# Patient Record
Sex: Female | Born: 1935 | Race: White | Hispanic: No | Smoking: Current some day smoker
Health system: Southern US, Community
[De-identification: ages and names within clinical notes are randomized; demographics above are authoritative.]

## PROBLEM LIST (undated history)

## (undated) DIAGNOSIS — F419 Anxiety disorder, unspecified: Secondary | ICD-10-CM

## (undated) DIAGNOSIS — I10 Essential (primary) hypertension: Secondary | ICD-10-CM

## (undated) DIAGNOSIS — E119 Type 2 diabetes mellitus without complications: Secondary | ICD-10-CM

## (undated) DIAGNOSIS — M109 Gout, unspecified: Secondary | ICD-10-CM

## (undated) HISTORY — DX: Gout, unspecified: M10.9

## (undated) HISTORY — DX: Type 2 diabetes mellitus without complications: E11.9

## (undated) HISTORY — DX: Anxiety disorder, unspecified: F41.9

## (undated) HISTORY — DX: Essential (primary) hypertension: I10

---

## 2004-01-06 ENCOUNTER — Ambulatory Visit: Payer: Self-pay | Admitting: Physician Assistant

## 2004-02-01 ENCOUNTER — Ambulatory Visit: Payer: Self-pay | Admitting: Pain Medicine

## 2004-02-09 ENCOUNTER — Ambulatory Visit: Payer: Self-pay | Admitting: Pain Medicine

## 2004-03-21 ENCOUNTER — Ambulatory Visit: Payer: Self-pay | Admitting: Pain Medicine

## 2004-03-24 ENCOUNTER — Ambulatory Visit: Payer: Self-pay | Admitting: Family Medicine

## 2004-04-20 ENCOUNTER — Ambulatory Visit: Payer: Self-pay | Admitting: Pain Medicine

## 2004-05-01 ENCOUNTER — Ambulatory Visit: Payer: Self-pay | Admitting: Pain Medicine

## 2004-06-06 ENCOUNTER — Ambulatory Visit: Payer: Self-pay | Admitting: Pain Medicine

## 2004-06-14 ENCOUNTER — Ambulatory Visit: Payer: Self-pay | Admitting: Pain Medicine

## 2004-07-20 ENCOUNTER — Ambulatory Visit: Payer: Self-pay | Admitting: Pain Medicine

## 2004-07-26 ENCOUNTER — Ambulatory Visit: Payer: Self-pay | Admitting: Pain Medicine

## 2004-08-31 ENCOUNTER — Ambulatory Visit: Payer: Self-pay | Admitting: Pain Medicine

## 2004-09-13 ENCOUNTER — Encounter: Payer: Self-pay | Admitting: Pain Medicine

## 2004-09-28 ENCOUNTER — Ambulatory Visit: Payer: Self-pay | Admitting: Pain Medicine

## 2004-10-04 ENCOUNTER — Encounter: Payer: Self-pay | Admitting: Pain Medicine

## 2004-10-26 ENCOUNTER — Ambulatory Visit: Payer: Self-pay | Admitting: Pain Medicine

## 2004-10-31 ENCOUNTER — Encounter: Payer: Self-pay | Admitting: Pain Medicine

## 2004-11-27 ENCOUNTER — Ambulatory Visit: Payer: Self-pay | Admitting: Psychiatry

## 2004-12-01 ENCOUNTER — Encounter: Payer: Self-pay | Admitting: Pain Medicine

## 2004-12-14 ENCOUNTER — Ambulatory Visit: Payer: Self-pay | Admitting: Pain Medicine

## 2004-12-25 ENCOUNTER — Ambulatory Visit: Payer: Self-pay | Admitting: Family Medicine

## 2004-12-31 ENCOUNTER — Ambulatory Visit: Payer: Self-pay | Admitting: Family Medicine

## 2005-02-13 ENCOUNTER — Ambulatory Visit: Payer: Self-pay | Admitting: Pain Medicine

## 2005-04-17 ENCOUNTER — Ambulatory Visit: Payer: Self-pay | Admitting: Pain Medicine

## 2005-05-22 ENCOUNTER — Ambulatory Visit: Payer: Self-pay | Admitting: Pain Medicine

## 2005-08-14 ENCOUNTER — Ambulatory Visit: Payer: Self-pay | Admitting: Pain Medicine

## 2005-11-13 ENCOUNTER — Ambulatory Visit: Payer: Self-pay | Admitting: Pain Medicine

## 2006-02-14 ENCOUNTER — Ambulatory Visit: Payer: Self-pay

## 2006-07-28 ENCOUNTER — Other Ambulatory Visit: Payer: Self-pay

## 2006-07-28 ENCOUNTER — Emergency Department: Payer: Self-pay | Admitting: Emergency Medicine

## 2008-03-29 ENCOUNTER — Ambulatory Visit: Payer: Self-pay | Admitting: Family Medicine

## 2008-04-07 ENCOUNTER — Ambulatory Visit: Payer: Self-pay | Admitting: Family Medicine

## 2010-01-17 ENCOUNTER — Ambulatory Visit: Payer: Self-pay

## 2011-04-12 ENCOUNTER — Ambulatory Visit: Payer: Self-pay | Admitting: Family Medicine

## 2011-09-02 ENCOUNTER — Inpatient Hospital Stay: Payer: Self-pay | Admitting: Internal Medicine

## 2011-09-02 LAB — BASIC METABOLIC PANEL
Calcium, Total: 9.1 mg/dL (ref 8.5–10.1)
Chloride: 100 mmol/L (ref 98–107)
Co2: 28 mmol/L (ref 21–32)
Creatinine: 1.06 mg/dL (ref 0.60–1.30)
EGFR (African American): 58 — ABNORMAL LOW
Glucose: 168 mg/dL — ABNORMAL HIGH (ref 65–99)
Sodium: 139 mmol/L (ref 136–145)

## 2011-09-02 LAB — PROTIME-INR: Prothrombin Time: 22.9 secs — ABNORMAL HIGH (ref 11.5–14.7)

## 2011-09-02 LAB — CBC
HCT: 37.6 % (ref 35.0–47.0)
MCH: 32.6 pg (ref 26.0–34.0)
RBC: 3.91 10*6/uL (ref 3.80–5.20)
WBC: 11.9 10*3/uL — ABNORMAL HIGH (ref 3.6–11.0)

## 2011-09-03 LAB — BASIC METABOLIC PANEL WITH GFR
Anion Gap: 9
BUN: 10 mg/dL
Calcium, Total: 8.6 mg/dL
Chloride: 102 mmol/L
Co2: 28 mmol/L
Creatinine: 0.96 mg/dL
EGFR (African American): >60
EGFR (Non-African Amer.): 56 — ABNORMAL LOW
Glucose: 167 mg/dL — ABNORMAL HIGH
Osmolality: 280
Potassium: 3.6 mmol/L
Sodium: 139 mmol/L

## 2011-09-03 LAB — CBC WITH DIFFERENTIAL/PLATELET
Basophil #: 0 x10 3/mm 3
Basophil %: 0.3 %
Eosinophil #: 0.1 x10 3/mm 3
Eosinophil %: 1.1 %
HCT: 34.9 % — ABNORMAL LOW
HGB: 11.6 g/dL — ABNORMAL LOW
Lymphocyte %: 18.9 %
Lymphs Abs: 2.1 x10 3/mm 3
MCH: 31.5 pg
MCHC: 33.1 g/dL
MCV: 95 fL
Monocyte #: 1 "x10 3/mm " — ABNORMAL HIGH
Monocyte %: 8.8 %
Neutrophil #: 7.8 x10 3/mm 3 — ABNORMAL HIGH
Neutrophil %: 70.9 %
Platelet: 251 x10 3/mm 3
RBC: 3.67 X10 6/mm 3 — ABNORMAL LOW
RDW: 16 % — ABNORMAL HIGH
WBC: 11.1 x10 3/mm 3 — ABNORMAL HIGH

## 2011-09-03 LAB — URIC ACID: Uric Acid: 5.6 mg/dL

## 2011-09-04 LAB — PROTIME-INR: INR: 1.8

## 2011-09-08 LAB — CULTURE, BLOOD (SINGLE)

## 2012-03-28 ENCOUNTER — Ambulatory Visit: Payer: Self-pay | Admitting: Family Medicine

## 2012-10-22 ENCOUNTER — Ambulatory Visit: Payer: Self-pay | Admitting: Family Medicine

## 2013-04-24 ENCOUNTER — Ambulatory Visit (INDEPENDENT_AMBULATORY_CARE_PROVIDER_SITE_OTHER): Payer: Medicare Other | Admitting: Podiatry

## 2013-04-24 ENCOUNTER — Other Ambulatory Visit: Payer: Self-pay | Admitting: *Deleted

## 2013-04-24 ENCOUNTER — Encounter: Payer: Self-pay | Admitting: Podiatry

## 2013-04-24 VITALS — BP 122/67 | HR 86 | Resp 14 | Ht 65.0 in | Wt 184.0 lb

## 2013-04-24 DIAGNOSIS — M79609 Pain in unspecified limb: Secondary | ICD-10-CM

## 2013-04-24 DIAGNOSIS — B351 Tinea unguium: Secondary | ICD-10-CM

## 2013-04-24 DIAGNOSIS — Q828 Other specified congenital malformations of skin: Secondary | ICD-10-CM

## 2013-04-24 DIAGNOSIS — E1159 Type 2 diabetes mellitus with other circulatory complications: Secondary | ICD-10-CM

## 2013-04-24 DIAGNOSIS — M2040 Other hammer toe(s) (acquired), unspecified foot: Secondary | ICD-10-CM

## 2013-04-24 NOTE — Progress Notes (Signed)
Subjective:     Patient ID: Stanton KidneyDinah W Due, female   DOB: Dec 30, 1930, 80 y.o.   MRN: [id number]  HPI patient presents after not being seen for several years with long-term diabetes diminished circulatory and neurological status and to severely painful keratotic lesion sub-13 left and severely elongated nails 1-5 both feet that are thick and and painful   Review of Systems     Objective:   Physical Exam Neurovascular status diminished with diminishment of sharp toe vibratory DP and PT pulses hair growth varicosities in the ankle and severe foot structural changes left foot. Severe keratotic lesion sub-13 left secondary to pressure points and the long gaited nail disease 1-5 both feet    Assessment:     At wrist diabetic with structural damage and keratotic lesion x2 left along with nail disease 1-5 both feet    Plan:     Educated patient on what to watch for and caregiver and debrided lesions on left foot and advised on soaks also debrided nailbeds 1-5 both feet and did cast for diabetic shoes of which we will receive approval from Dr. Sherrie MustacheFisher due to her at risk condition

## 2013-04-24 NOTE — Progress Notes (Signed)
NEED TO HAVE MY FEET CHECKED AND TOENAILS TRIMMED THEY ARE PAINING ME , THE BOTTOM OF MY FEET BURN.

## 2013-05-18 ENCOUNTER — Encounter: Payer: Self-pay | Admitting: *Deleted

## 2013-05-18 NOTE — Progress Notes (Signed)
SENT PT POST CARD LETTING HER KNOW DIABETIC SHOES ARE HERE. 

## 2013-06-19 ENCOUNTER — Ambulatory Visit (INDEPENDENT_AMBULATORY_CARE_PROVIDER_SITE_OTHER): Payer: Medicare Other

## 2013-06-19 ENCOUNTER — Ambulatory Visit (INDEPENDENT_AMBULATORY_CARE_PROVIDER_SITE_OTHER): Payer: Medicare Other | Admitting: Podiatry

## 2013-06-19 VITALS — BP 122/67 | HR 86 | Temp 96.9°F | Resp 16

## 2013-06-19 DIAGNOSIS — M79609 Pain in unspecified limb: Secondary | ICD-10-CM

## 2013-06-19 DIAGNOSIS — L97509 Non-pressure chronic ulcer of other part of unspecified foot with unspecified severity: Secondary | ICD-10-CM

## 2013-06-19 DIAGNOSIS — M2010 Hallux valgus (acquired), unspecified foot: Secondary | ICD-10-CM

## 2013-06-19 DIAGNOSIS — E1159 Type 2 diabetes mellitus with other circulatory complications: Secondary | ICD-10-CM

## 2013-06-19 DIAGNOSIS — M79672 Pain in left foot: Secondary | ICD-10-CM

## 2013-06-19 MED ORDER — CEPHALEXIN 500 MG PO CAPS: 500.0000 mg | ORAL_CAPSULE | Freq: Four times a day (QID) | ORAL | Status: AC

## 2013-06-19 NOTE — Progress Notes (Signed)
Subjective:     Patient ID: Megan Johns, female   DOB: 1930-08-28, 80 y.o.   MRN: [id number]  HPI patient presents stating this callus is really been bothering me and I have had a small amount redness around the area and I'm here to pick up a diabetic shoes. Patient has trouble with the left foot and does have long-term diabetes   Review of Systems     Objective:   Physical Exam Neurovascular status is found to be unchanged and well oriented x3. Is found to have severe structural imbalance left foot with large keratotic lesion on the medial aspect of the first metatarsal head that is very painful when pressed with a small amount of redness surrounding the area. No proximal edema erythema or drainage was noted    Assessment:     Localized area of breakdown secondary to severe structural deformity creating pressure    Plan:     X-ray and H&P reviewed with patient. Debridement of the area was accomplished with some small amount of drainage that is localized to first today pathological specimen for culture and sensitivity and then flushed I applied Iodosorb to the open area after sterile debridement and incised on soaks and Neosporin usage. Placed on cephalexin 500 mg 3 times a day and instructed any proximal edema erythema drainage or any systemic signs of infection were to occur she is to go straight to the emergency room. Reappoint one week and less issues prior and also dispensed diabetic shoes with all instructions today

## 2013-06-19 NOTE — Progress Notes (Signed)
That callus on the bottom of left foot,also pick up diabetic shoes

## 2013-06-26 ENCOUNTER — Ambulatory Visit (INDEPENDENT_AMBULATORY_CARE_PROVIDER_SITE_OTHER): Payer: Medicare Other | Admitting: Podiatry

## 2013-06-26 VITALS — Resp 16 | Ht 66.0 in | Wt 208.0 lb

## 2013-06-26 DIAGNOSIS — L02619 Cutaneous abscess of unspecified foot: Secondary | ICD-10-CM

## 2013-06-26 DIAGNOSIS — L03119 Cellulitis of unspecified part of limb: Principal | ICD-10-CM

## 2013-06-26 NOTE — Progress Notes (Signed)
Subjective:     Patient ID: Megan KidneyDinah W Jaster, female   DOB: 01-Jan-1931, 80 y.o.   MRN: [id number]  HPI patient presents with signs stating I think the left foot is doing better. Had breakdown of tissue left first metatarsal head secondary to severe structural abnormality   Review of Systems     Objective:   Physical Exam Neurovascular status unchanged with large keratotic tissue sub-first metatarsal left that has healed well with no current drainage no current odor no current lymph node distention erythema or edema    Assessment:     Well-healed area left plantar first metatarsal with debridement soaks and antibiotics    Plan:     Advised on continued soaks and keeping the area clean and reappoint if any redness drainage or other issues should occur. If not reappoint for routine debridement

## 2013-07-24 ENCOUNTER — Ambulatory Visit: Payer: Medicare Other | Admitting: Podiatry

## 2013-07-31 ENCOUNTER — Ambulatory Visit: Payer: Medicare Other | Admitting: Podiatry

## 2013-08-03 ENCOUNTER — Ambulatory Visit: Payer: Self-pay | Admitting: Family Medicine

## 2013-08-07 ENCOUNTER — Ambulatory Visit: Payer: Medicare Other | Admitting: Podiatry

## 2013-08-10 ENCOUNTER — Ambulatory Visit: Payer: Self-pay | Admitting: Internal Medicine

## 2013-08-10 LAB — COMPREHENSIVE METABOLIC PANEL
ALT: 21 U/L (ref 12–78)
AST: 10 U/L — AB (ref 15–37)
Albumin: 3.8 g/dL (ref 3.4–5.0)
Alkaline Phosphatase: 66 U/L
Anion Gap: 12 (ref 7–16)
BUN: 28 mg/dL — ABNORMAL HIGH (ref 7–18)
Bilirubin,Total: 0.2 mg/dL (ref 0.2–1.0)
Calcium, Total: 9.2 mg/dL (ref 8.5–10.1)
Chloride: 99 mmol/L (ref 98–107)
Co2: 26 mmol/L (ref 21–32)
Creatinine: 1.37 mg/dL — ABNORMAL HIGH (ref 0.60–1.30)
EGFR (Non-African Amer.): 36 — ABNORMAL LOW
GFR CALC AF AMER: 42 — AB
Glucose: 223 mg/dL — ABNORMAL HIGH (ref 65–99)
OSMOLALITY: 286 (ref 275–301)
Potassium: 4.6 mmol/L (ref 3.5–5.1)
Sodium: 137 mmol/L (ref 136–145)
Total Protein: 7.9 g/dL (ref 6.4–8.2)

## 2013-08-10 LAB — CBC CANCER CENTER
BASOS ABS: 0 x10 3/mm (ref 0.0–0.1)
BASOS PCT: 0.2 %
Eosinophil #: 0 x10 3/mm (ref 0.0–0.7)
Eosinophil %: 0 %
HCT: 38.3 % (ref 35.0–47.0)
HGB: 12.4 g/dL (ref 12.0–16.0)
Lymphocyte #: 2.3 x10 3/mm (ref 1.0–3.6)
Lymphocyte %: 15.6 %
MCH: 32.5 pg (ref 26.0–34.0)
MCHC: 32.4 g/dL (ref 32.0–36.0)
MCV: 101 fL — ABNORMAL HIGH (ref 80–100)
MONOS PCT: 4.3 %
Monocyte #: 0.6 x10 3/mm (ref 0.2–0.9)
NEUTROS ABS: 11.7 x10 3/mm — AB (ref 1.4–6.5)
Neutrophil %: 79.9 %
Platelet: 323 x10 3/mm (ref 150–440)
RBC: 3.82 10*6/uL (ref 3.80–5.20)
RDW: 17.1 % — ABNORMAL HIGH (ref 11.5–14.5)
WBC: 14.7 x10 3/mm — ABNORMAL HIGH (ref 3.6–11.0)

## 2013-08-11 LAB — AFP TUMOR MARKER: AFP-Tumor Marker: 1.3 ng/mL (ref 0.0–8.3)

## 2013-08-11 LAB — BETA HCG QUANT (REF LAB)

## 2013-08-11 LAB — CANCER ANTIGEN 27.29: CA 27.29: 79.2 U/mL — ABNORMAL HIGH (ref 0.0–38.6)

## 2013-08-11 LAB — CEA: CEA: 1.5 ng/mL (ref 0.0–4.7)

## 2013-08-14 LAB — BASIC METABOLIC PANEL
Anion Gap: 8 (ref 7–16)
BUN: 39 mg/dL — ABNORMAL HIGH (ref 7–18)
CHLORIDE: 99 mmol/L (ref 98–107)
CO2: 30 mmol/L (ref 21–32)
Calcium, Total: 8.9 mg/dL (ref 8.5–10.1)
Creatinine: 1.44 mg/dL — ABNORMAL HIGH (ref 0.60–1.30)
GFR CALC AF AMER: 39 — AB
GFR CALC NON AF AMER: 34 — AB
GLUCOSE: 190 mg/dL — AB (ref 65–99)
OSMOLALITY: 288 (ref 275–301)
Potassium: 4.1 mmol/L (ref 3.5–5.1)
Sodium: 137 mmol/L (ref 136–145)

## 2013-08-17 LAB — CBC CANCER CENTER
BASOS ABS: 0.1 x10 3/mm (ref 0.0–0.1)
Basophil %: 0.9 %
EOS PCT: 1.4 %
Eosinophil #: 0.2 x10 3/mm (ref 0.0–0.7)
HCT: 38.8 % (ref 35.0–47.0)
HGB: 13 g/dL (ref 12.0–16.0)
Lymphocyte #: 3.4 x10 3/mm (ref 1.0–3.6)
Lymphocyte %: 23.2 %
MCH: 33 pg (ref 26.0–34.0)
MCHC: 33.5 g/dL (ref 32.0–36.0)
MCV: 99 fL (ref 80–100)
Monocyte #: 1 x10 3/mm — ABNORMAL HIGH (ref 0.2–0.9)
Monocyte %: 7 %
NEUTROS ABS: 9.8 x10 3/mm — AB (ref 1.4–6.5)
Neutrophil %: 67.5 %
Platelet: 248 x10 3/mm (ref 150–440)
RBC: 3.94 10*6/uL (ref 3.80–5.20)
RDW: 17.3 % — ABNORMAL HIGH (ref 11.5–14.5)
WBC: 14.6 x10 3/mm — ABNORMAL HIGH (ref 3.6–11.0)

## 2013-08-17 LAB — PROTIME-INR
INR: 1.1
Prothrombin Time: 14.3 secs (ref 11.5–14.7)

## 2013-08-17 LAB — CA 125: CA 125: 227.3 U/mL — ABNORMAL HIGH (ref 0.0–34.0)

## 2013-08-20 ENCOUNTER — Ambulatory Visit: Payer: Self-pay | Admitting: Internal Medicine

## 2013-08-20 LAB — PROTIME-INR
INR: 1
Prothrombin Time: 12.6 secs (ref 11.5–14.7)

## 2013-08-25 ENCOUNTER — Ambulatory Visit (INDEPENDENT_AMBULATORY_CARE_PROVIDER_SITE_OTHER): Payer: Medicare Other | Admitting: Podiatry

## 2013-08-25 VITALS — BP 159/86 | HR 83 | Resp 16

## 2013-08-25 DIAGNOSIS — E1149 Type 2 diabetes mellitus with other diabetic neurological complication: Secondary | ICD-10-CM

## 2013-08-25 DIAGNOSIS — B351 Tinea unguium: Secondary | ICD-10-CM

## 2013-08-25 DIAGNOSIS — Q828 Other specified congenital malformations of skin: Secondary | ICD-10-CM

## 2013-08-25 DIAGNOSIS — E1159 Type 2 diabetes mellitus with other circulatory complications: Secondary | ICD-10-CM

## 2013-08-25 DIAGNOSIS — M79609 Pain in unspecified limb: Secondary | ICD-10-CM

## 2013-08-25 LAB — BASIC METABOLIC PANEL
Anion Gap: 9 (ref 7–16)
BUN: 25 mg/dL — ABNORMAL HIGH (ref 7–18)
CO2: 29 mmol/L (ref 21–32)
CREATININE: 1.26 mg/dL (ref 0.60–1.30)
Calcium, Total: 8.8 mg/dL (ref 8.5–10.1)
Chloride: 101 mmol/L (ref 98–107)
EGFR (African American): 46 — ABNORMAL LOW
EGFR (Non-African Amer.): 40 — ABNORMAL LOW
Glucose: 212 mg/dL — ABNORMAL HIGH (ref 65–99)
OSMOLALITY: 288 (ref 275–301)
POTASSIUM: 4.5 mmol/L (ref 3.5–5.1)
Sodium: 139 mmol/L (ref 136–145)

## 2013-08-25 LAB — CBC CANCER CENTER
BASOS ABS: 0 x10 3/mm (ref 0.0–0.1)
Basophil %: 0.3 %
EOS PCT: 0.4 %
Eosinophil #: 0 x10 3/mm (ref 0.0–0.7)
HCT: 38.2 % (ref 35.0–47.0)
HGB: 12.5 g/dL (ref 12.0–16.0)
LYMPHS ABS: 3.8 x10 3/mm — AB (ref 1.0–3.6)
Lymphocyte %: 31.8 %
MCH: 33.1 pg (ref 26.0–34.0)
MCHC: 32.6 g/dL (ref 32.0–36.0)
MCV: 102 fL — AB (ref 80–100)
MONO ABS: 0.9 x10 3/mm (ref 0.2–0.9)
Monocyte %: 7 %
NEUTROS ABS: 7.3 x10 3/mm — AB (ref 1.4–6.5)
NEUTROS PCT: 60.5 %
PLATELETS: 267 x10 3/mm (ref 150–440)
RBC: 3.77 10*6/uL — AB (ref 3.80–5.20)
RDW: 17.3 % — ABNORMAL HIGH (ref 11.5–14.5)
WBC: 12.1 x10 3/mm — ABNORMAL HIGH (ref 3.6–11.0)

## 2013-08-25 LAB — PATHOLOGY REPORT

## 2013-08-25 LAB — PROTIME-INR
INR: 1.5
PROTHROMBIN TIME: 17.7 s — AB (ref 11.5–14.7)

## 2013-08-25 NOTE — Progress Notes (Signed)
Subjective:     Patient ID: Megan Johns, female   DOB: [birth year], 80 y.o.   MRN: [id number]  HPI patient presents with thick painful nailbeds 1-5 both feet she is unable to cut and large keratotic lesion on the medial side of the first metatarsal that is very thick and im possible for her cut with history of issues with diabetes which creates him at risk condition   Review of Systems     Objective:   Physical Exam Diminishment neurovascular system noted and thick yellow brittle nailbeds 1-5 both feet that are painful and large keratotic lesion plantar aspect left first metatarsal with history of breakdown in the past    Assessment:      Mycotic nail infection with pain and keratotic lesion sub-first metatarsal left that is at risk for breakdown    Plan:     Debridement nailbeds 1-5 both feet with no iatrogenic bleeding noted and debridement lesion on the left foot with no bleeding noted

## 2013-08-31 ENCOUNTER — Ambulatory Visit: Payer: Self-pay | Admitting: Internal Medicine

## 2013-09-01 LAB — CBC CANCER CENTER
BASOS ABS: 0.1 x10 3/mm (ref 0.0–0.1)
BASOS PCT: 0.6 %
EOS ABS: 0.1 x10 3/mm (ref 0.0–0.7)
EOS PCT: 0.5 %
HCT: 39 % (ref 35.0–47.0)
HGB: 12.7 g/dL (ref 12.0–16.0)
LYMPHS ABS: 3.6 x10 3/mm (ref 1.0–3.6)
Lymphocyte %: 28.9 %
MCH: 32.9 pg (ref 26.0–34.0)
MCHC: 32.5 g/dL (ref 32.0–36.0)
MCV: 101 fL — AB (ref 80–100)
MONO ABS: 0.8 x10 3/mm (ref 0.2–0.9)
Monocyte %: 6.7 %
Neutrophil #: 7.9 x10 3/mm — ABNORMAL HIGH (ref 1.4–6.5)
Neutrophil %: 63.3 %
PLATELETS: 266 x10 3/mm (ref 150–440)
RBC: 3.86 10*6/uL (ref 3.80–5.20)
RDW: 17.3 % — AB (ref 11.5–14.5)
WBC: 12.4 x10 3/mm — ABNORMAL HIGH (ref 3.6–11.0)

## 2013-09-08 LAB — CBC CANCER CENTER
BASOS ABS: 0.1 x10 3/mm (ref 0.0–0.1)
BASOS PCT: 0.5 %
Eosinophil #: 0.1 x10 3/mm (ref 0.0–0.7)
Eosinophil %: 0.7 %
HCT: 40.2 % (ref 35.0–47.0)
HGB: 13.1 g/dL (ref 12.0–16.0)
Lymphocyte #: 3 x10 3/mm (ref 1.0–3.6)
Lymphocyte %: 24 %
MCH: 32.8 pg (ref 26.0–34.0)
MCHC: 32.6 g/dL (ref 32.0–36.0)
MCV: 101 fL — ABNORMAL HIGH (ref 80–100)
Monocyte #: 0.8 x10 3/mm (ref 0.2–0.9)
Monocyte %: 6.5 %
Neutrophil #: 8.5 x10 3/mm — ABNORMAL HIGH (ref 1.4–6.5)
Neutrophil %: 68.3 %
Platelet: 239 x10 3/mm (ref 150–440)
RBC: 3.99 10*6/uL (ref 3.80–5.20)
RDW: 17.6 % — AB (ref 11.5–14.5)
WBC: 12.5 x10 3/mm — AB (ref 3.6–11.0)

## 2013-09-11 ENCOUNTER — Ambulatory Visit: Payer: Self-pay | Admitting: Internal Medicine

## 2013-09-11 LAB — CBC WITH DIFFERENTIAL/PLATELET
BASOS ABS: 0 10*3/uL (ref 0.0–0.1)
Basophil %: 0.3 %
EOS ABS: 0.1 10*3/uL (ref 0.0–0.7)
EOS PCT: 0.5 %
HCT: 39.6 % (ref 35.0–47.0)
HGB: 13 g/dL (ref 12.0–16.0)
Lymphocyte #: 2.8 10*3/uL (ref 1.0–3.6)
Lymphocyte %: 20.8 %
MCH: 33.1 pg (ref 26.0–34.0)
MCHC: 32.7 g/dL (ref 32.0–36.0)
MCV: 101 fL — ABNORMAL HIGH (ref 80–100)
Monocyte #: 0.9 x10 3/mm (ref 0.2–0.9)
Monocyte %: 6.7 %
NEUTROS PCT: 71.7 %
Neutrophil #: 9.6 10*3/uL — ABNORMAL HIGH (ref 1.4–6.5)
Platelet: 210 10*3/uL (ref 150–440)
RBC: 3.91 10*6/uL (ref 3.80–5.20)
RDW: 17.5 % — ABNORMAL HIGH (ref 11.5–14.5)
WBC: 13.4 10*3/uL — AB (ref 3.6–11.0)

## 2013-09-11 LAB — PROTIME-INR
INR: 1
Prothrombin Time: 12.6 secs (ref 11.5–14.7)

## 2013-09-16 LAB — PATHOLOGY REPORT

## 2013-09-30 ENCOUNTER — Ambulatory Visit: Payer: Self-pay | Admitting: Internal Medicine

## 2013-10-01 ENCOUNTER — Other Ambulatory Visit: Payer: Self-pay | Admitting: Internal Medicine

## 2013-10-01 LAB — PROTIME-INR
INR: 4.5 — AB
PROTHROMBIN TIME: 41.2 s — AB (ref 11.5–14.7)

## 2013-10-02 ENCOUNTER — Ambulatory Visit: Payer: Self-pay | Admitting: Internal Medicine

## 2013-10-08 ENCOUNTER — Other Ambulatory Visit: Payer: Self-pay | Admitting: Internal Medicine

## 2013-10-08 LAB — PROTIME-INR
INR: 4.8
Prothrombin Time: 43.4 secs — ABNORMAL HIGH (ref 11.5–14.7)

## 2013-10-14 ENCOUNTER — Other Ambulatory Visit: Payer: Self-pay | Admitting: Internal Medicine

## 2013-10-14 LAB — PROTIME-INR
INR: 1.6
Prothrombin Time: 19.1 secs — ABNORMAL HIGH (ref 11.5–14.7)

## 2013-11-26 ENCOUNTER — Other Ambulatory Visit: Payer: Self-pay | Admitting: Internal Medicine

## 2013-11-26 LAB — CREATININE, SERUM
Creatinine: 1.28 mg/dL (ref 0.60–1.30)
EGFR (Non-African Amer.): 39 — ABNORMAL LOW
GFR CALC AF AMER: 45 — AB

## 2013-11-26 LAB — POTASSIUM: POTASSIUM: 3.8 mmol/L (ref 3.5–5.1)

## 2013-11-27 ENCOUNTER — Other Ambulatory Visit: Payer: Medicare Other

## 2013-12-01 ENCOUNTER — Ambulatory Visit: Payer: Self-pay | Admitting: Hospice and Palliative Medicine

## 2013-12-01 ENCOUNTER — Other Ambulatory Visit: Payer: Self-pay | Admitting: Internal Medicine

## 2013-12-01 LAB — POTASSIUM: Potassium: 4.2 mmol/L (ref 3.5–5.1)

## 2013-12-01 LAB — CREATININE, SERUM
Creatinine: 1.44 mg/dL — ABNORMAL HIGH (ref 0.60–1.30)
EGFR (African American): 39 — ABNORMAL LOW
GFR CALC NON AF AMER: 34 — AB

## 2013-12-05 ENCOUNTER — Inpatient Hospital Stay: Payer: Self-pay | Admitting: Internal Medicine

## 2013-12-05 LAB — BASIC METABOLIC PANEL
Anion Gap: 16 (ref 7–16)
Anion Gap: 8 (ref 7–16)
BUN: 63 mg/dL — ABNORMAL HIGH (ref 7–18)
BUN: 54 mg/dL — ABNORMAL HIGH (ref 7–18)
CALCIUM: 8.5 mg/dL (ref 8.5–10.1)
Calcium, Total: 8.4 mg/dL — ABNORMAL LOW (ref 8.5–10.1)
Chloride: 110 mmol/L — ABNORMAL HIGH (ref 98–107)
Chloride: 113 mmol/L — ABNORMAL HIGH (ref 98–107)
Co2: 18 mmol/L — ABNORMAL LOW (ref 21–32)
Co2: 26 mmol/L (ref 21–32)
Creatinine: 1.59 mg/dL — ABNORMAL HIGH (ref 0.60–1.30)
Creatinine: 1.22 mg/dL (ref 0.60–1.30)
EGFR (African American): 48 — ABNORMAL LOW
EGFR (Non-African Amer.): 30 — ABNORMAL LOW
GFR CALC AF AMER: 35 — AB
GFR CALC NON AF AMER: 42 — AB
GLUCOSE: 238 mg/dL — AB (ref 65–99)
Glucose: 497 mg/dL — ABNORMAL HIGH (ref 65–99)
Osmolality: 327 (ref 275–301)
Osmolality: 315 (ref 275–301)
POTASSIUM: 3.5 mmol/L (ref 3.5–5.1)
POTASSIUM: 2.9 mmol/L — AB (ref 3.5–5.1)
Sodium: 144 mmol/L (ref 136–145)
Sodium: 147 mmol/L — ABNORMAL HIGH (ref 136–145)

## 2013-12-05 LAB — URINALYSIS, COMPLETE
Bilirubin,UR: NEGATIVE
Nitrite: POSITIVE
Ph: 5 (ref 4.5–8.0)
Protein: 30
RBC,UR: 14 /HPF (ref 0–5)
Specific Gravity: 1.024 (ref 1.003–1.030)
Squamous Epithelial: NONE SEEN
WBC UR: 442 /HPF (ref 0–5)

## 2013-12-05 LAB — CBC
HCT: 46.9 % (ref 35.0–47.0)
HGB: 15 g/dL (ref 12.0–16.0)
MCH: 33.5 pg (ref 26.0–34.0)
MCHC: 32.1 g/dL (ref 32.0–36.0)
MCV: 105 fL — AB (ref 80–100)
Platelet: 124 10*3/uL — ABNORMAL LOW (ref 150–440)
RBC: 4.49 10*6/uL (ref 3.80–5.20)
RDW: 16.4 % — ABNORMAL HIGH (ref 11.5–14.5)
WBC: 18 10*3/uL — AB (ref 3.6–11.0)

## 2013-12-05 LAB — COMPREHENSIVE METABOLIC PANEL
ALBUMIN: 2.6 g/dL — AB (ref 3.4–5.0)
ALK PHOS: 100 U/L
AST: 14 U/L — AB (ref 15–37)
Anion Gap: 18 — ABNORMAL HIGH (ref 7–16)
BUN: 67 mg/dL — AB (ref 7–18)
Bilirubin,Total: 0.6 mg/dL (ref 0.2–1.0)
CALCIUM: 8.9 mg/dL (ref 8.5–10.1)
CREATININE: 1.86 mg/dL — AB (ref 0.60–1.30)
Chloride: 102 mmol/L (ref 98–107)
Co2: 20 mmol/L — ABNORMAL LOW (ref 21–32)
EGFR (African American): 29 — ABNORMAL LOW
EGFR (Non-African Amer.): 25 — ABNORMAL LOW
Glucose: 682 mg/dL (ref 65–99)
Osmolality: 331 (ref 275–301)
POTASSIUM: 3.7 mmol/L (ref 3.5–5.1)
SGPT (ALT): 21 U/L
Sodium: 140 mmol/L (ref 136–145)
TOTAL PROTEIN: 6.6 g/dL (ref 6.4–8.2)

## 2013-12-05 LAB — PROTIME-INR
INR: 1
Prothrombin Time: 13.2 secs (ref 11.5–14.7)

## 2013-12-05 LAB — CK TOTAL AND CKMB (NOT AT ARMC)
CK, Total: 36 U/L
CK-MB: 4.8 ng/mL — ABNORMAL HIGH (ref 0.5–3.6)

## 2013-12-05 LAB — PHOSPHORUS: PHOSPHORUS: 3.7 mg/dL (ref 2.5–4.9)

## 2013-12-05 LAB — MAGNESIUM: MAGNESIUM: 1.9 mg/dL

## 2013-12-05 LAB — TROPONIN I: Troponin-I: 0.14 ng/mL — ABNORMAL HIGH

## 2013-12-05 LAB — HEMOGLOBIN A1C: Hemoglobin A1C: 10.8 % — ABNORMAL HIGH (ref 4.2–6.3)

## 2013-12-06 LAB — CBC WITH DIFFERENTIAL/PLATELET
BASOS ABS: 0 10*3/uL (ref 0.0–0.1)
BASOS PCT: 0.1 %
EOS ABS: 0 10*3/uL (ref 0.0–0.7)
Eosinophil %: 0 %
HCT: 35.5 % (ref 35.0–47.0)
HGB: 12 g/dL (ref 12.0–16.0)
LYMPHS PCT: 7 %
Lymphocyte #: 0.8 10*3/uL — ABNORMAL LOW (ref 1.0–3.6)
MCH: 33.9 pg (ref 26.0–34.0)
MCHC: 33.8 g/dL (ref 32.0–36.0)
MCV: 100 fL (ref 80–100)
MONO ABS: 0.5 x10 3/mm (ref 0.2–0.9)
MONOS PCT: 4.7 %
NEUTROS PCT: 88.2 %
Neutrophil #: 10.2 10*3/uL — ABNORMAL HIGH (ref 1.4–6.5)
Platelet: 95 10*3/uL — ABNORMAL LOW (ref 150–440)
RBC: 3.54 10*6/uL — ABNORMAL LOW (ref 3.80–5.20)
RDW: 16.8 % — AB (ref 11.5–14.5)
WBC: 11.6 10*3/uL — AB (ref 3.6–11.0)

## 2013-12-06 LAB — BASIC METABOLIC PANEL
ANION GAP: 8 (ref 7–16)
ANION GAP: 8 (ref 7–16)
BUN: 45 mg/dL — ABNORMAL HIGH (ref 7–18)
BUN: 35 mg/dL — ABNORMAL HIGH (ref 7–18)
CHLORIDE: 122 mmol/L — AB (ref 98–107)
CO2: 22 mmol/L (ref 21–32)
CREATININE: 0.89 mg/dL (ref 0.60–1.30)
Calcium, Total: 7.8 mg/dL — ABNORMAL LOW (ref 8.5–10.1)
Calcium, Total: 7.5 mg/dL — ABNORMAL LOW (ref 8.5–10.1)
Chloride: 120 mmol/L — ABNORMAL HIGH (ref 98–107)
Co2: 24 mmol/L (ref 21–32)
Creatinine: 1.14 mg/dL (ref 0.60–1.30)
EGFR (African American): >60
EGFR (Non-African Amer.): >60
GFR CALC AF AMER: 52 — AB
GFR CALC NON AF AMER: 45 — AB
GLUCOSE: 174 mg/dL — AB (ref 65–99)
Glucose: 190 mg/dL — ABNORMAL HIGH (ref 65–99)
OSMOLALITY: 318 (ref 275–301)
Osmolality: 314 (ref 275–301)
POTASSIUM: 3.3 mmol/L — AB (ref 3.5–5.1)
Potassium: 2.6 mmol/L — ABNORMAL LOW (ref 3.5–5.1)
SODIUM: 152 mmol/L — AB (ref 136–145)
Sodium: 152 mmol/L — ABNORMAL HIGH (ref 136–145)

## 2013-12-07 LAB — BASIC METABOLIC PANEL
Anion Gap: 9 (ref 7–16)
BUN: 31 mg/dL — AB (ref 7–18)
CALCIUM: 7.7 mg/dL — AB (ref 8.5–10.1)
Chloride: 122 mmol/L — ABNORMAL HIGH (ref 98–107)
Co2: 19 mmol/L — ABNORMAL LOW (ref 21–32)
Creatinine: 0.81 mg/dL (ref 0.60–1.30)
EGFR (Non-African Amer.): >60
Glucose: 171 mg/dL — ABNORMAL HIGH (ref 65–99)
Osmolality: 309 (ref 275–301)
Potassium: 3.5 mmol/L (ref 3.5–5.1)
SODIUM: 150 mmol/L — AB (ref 136–145)

## 2013-12-07 LAB — CBC WITH DIFFERENTIAL/PLATELET
Basophil #: 0 10*3/uL (ref 0.0–0.1)
Basophil %: 0.3 %
EOS ABS: 0 10*3/uL (ref 0.0–0.7)
Eosinophil %: 0.1 %
HCT: 35.9 % (ref 35.0–47.0)
HGB: 12.1 g/dL (ref 12.0–16.0)
Lymphocyte #: 0.8 10*3/uL — ABNORMAL LOW (ref 1.0–3.6)
Lymphocyte %: 6 %
MCH: 33.9 pg (ref 26.0–34.0)
MCHC: 33.7 g/dL (ref 32.0–36.0)
MCV: 101 fL — AB (ref 80–100)
MONOS PCT: 3.2 %
Monocyte #: 0.4 x10 3/mm (ref 0.2–0.9)
NEUTROS ABS: 12.8 10*3/uL — AB (ref 1.4–6.5)
NEUTROS PCT: 90.4 %
PLATELETS: 107 10*3/uL — AB (ref 150–440)
RBC: 3.57 10*6/uL — AB (ref 3.80–5.20)
RDW: 16.8 % — ABNORMAL HIGH (ref 11.5–14.5)
WBC: 14.1 10*3/uL — AB (ref 3.6–11.0)

## 2013-12-07 LAB — URINE CULTURE

## 2013-12-08 LAB — SODIUM: SODIUM: 146 mmol/L — AB (ref 136–145)

## 2013-12-09 LAB — CBC WITH DIFFERENTIAL/PLATELET
Basophil #: 0 10*3/uL (ref 0.0–0.1)
Basophil %: 0.1 %
Eosinophil #: 0 10*3/uL (ref 0.0–0.7)
Eosinophil %: 0 %
HCT: 33.9 % — AB (ref 35.0–47.0)
HGB: 11.6 g/dL — ABNORMAL LOW (ref 12.0–16.0)
Lymphocyte #: 1.2 10*3/uL (ref 1.0–3.6)
Lymphocyte %: 11.5 %
MCH: 34.4 pg — ABNORMAL HIGH (ref 26.0–34.0)
MCHC: 34.3 g/dL (ref 32.0–36.0)
MCV: 100 fL (ref 80–100)
Monocyte #: 0.5 x10 3/mm (ref 0.2–0.9)
Monocyte %: 5 %
Neutrophil #: 8.9 10*3/uL — ABNORMAL HIGH (ref 1.4–6.5)
Neutrophil %: 83.4 %
PLATELETS: 100 10*3/uL — AB (ref 150–440)
RBC: 3.37 10*6/uL — ABNORMAL LOW (ref 3.80–5.20)
RDW: 16.6 % — ABNORMAL HIGH (ref 11.5–14.5)
WBC: 10.7 10*3/uL (ref 3.6–11.0)

## 2013-12-09 LAB — BASIC METABOLIC PANEL
ANION GAP: 8 (ref 7–16)
BUN: 21 mg/dL — ABNORMAL HIGH (ref 7–18)
CO2: 20 mmol/L — AB (ref 21–32)
CREATININE: 0.75 mg/dL (ref 0.60–1.30)
Calcium, Total: 7.8 mg/dL — ABNORMAL LOW (ref 8.5–10.1)
Chloride: 118 mmol/L — ABNORMAL HIGH (ref 98–107)
EGFR (African American): >60
EGFR (Non-African Amer.): >60
GLUCOSE: 94 mg/dL (ref 65–99)
Osmolality: 293 (ref 275–301)
Potassium: 3.8 mmol/L (ref 3.5–5.1)
SODIUM: 146 mmol/L — AB (ref 136–145)

## 2013-12-10 LAB — CULTURE, BLOOD (SINGLE)

## 2013-12-13 LAB — CULTURE, BLOOD (SINGLE)

## 2013-12-31 ENCOUNTER — Ambulatory Visit: Payer: Self-pay | Admitting: Hospice and Palliative Medicine

## 2014-01-31 DEATH — deceased

## 2014-07-24 NOTE — Discharge Summary (Signed)
PATIENT NAME:  Megan Johns, Megan Johns MR#:  [id number] DATE OF BIRTH:  March 18, 1931  DATE OF ADMISSION:  12/05/2013 DATE OF DISCHARGE:  12/09/2013  PRESENTING COMPLAINT: Confusion, elevated sugars.   DISCHARGE DIAGNOSES:  1.  Escherichia coli urinary tract infection and Escherichia coli septicemia, resolved.  2.  Pneumonia.  3.  Acute renal failure, improved.  4.  Hypernatremia due to dehydration, resolved, improving.  5.  Hypertension.  6.  Type 2 diabetes.   CODE STATUS: No code, DNR.   MEDICATIONS: 1.  Alprazolam 0.5 mg p.o. daily.  2.  Clonazepam 1 mg b.i.d.  3.  Vitamin B12 at 1000 mcg subcutaneous once a month.  4.  Allopurinol 100 mg 2 tablets p.o. daily.  5.  Klor-Con 20 p.o. daily.  6.  Dexamethasone 4 mg b.i.d.  7.  Vitamin D3 one tablet twice a week on Wednesday and Sunday.  8.  Lovastatin 20 mg p.o. daily at bedtime.  9.  Metoprolol 50 mg b.i.d.  10.  Metformin 500 mg 2 tablets 2 times a day with meals.  11.  Lasix 20 mg 2 tablets once a day.  12.  Ceftin 500 mg p.o. b.i.d.  13.  Azithromycin 250 mg p.o. daily.   DIET: Low sodium, carbohydrate-controlled diet with Glucerna.  FOLLOWUP: With Dr. Sherrie MustacheFisher in 1 to 2 weeks.   LABORATORY DATA: Repeat blood cultures negative. White count 10.7, H and H is 11.6 and 39.9, platelet count is 100,000. Sodium is 146, potassium is 3.8, chloride is 118. Creatinine is 0.81. Blood cultures on admission were positive for Escherichia coli. Urine culture positive for Escherichia coli.   IMAGING: Chest x-ray showed right-sided pneumonia.   BRIEF SUMMARY OF HOSPITAL COURSE: Ms. Megan Johns is a very pleasant 81 year old Caucasian female, comes in with and confusion and elevated sugars. She was admitted with:  1.  Sepsis with Escherichia coli bacteremia, second source appeared to be urine. She also had right-sided pneumonia. She was started on IV Rocephin and Zithromax, changed to p.o. cefuroxime and Zithromax, which will finish a 10 day course. Repeat  blood cultures were negative. White count is back to normal. She is no longer septic and remained afebrile.  2.  Type 2 diabetes, initially uncontrolled however, stabilized after sliding scale insulin and p.o. metformin were added.  3.  Metabolic encephalopathy suspected due to sepsis and dehydration.  4.  Hypernatremia with acute renal insufficiency/failure, resolved with IV fluids. Sodium was up to 146 at discharge, creatinine was 0.81.  5.  History of ovarian cancer. Follow up with oncology.  6.  Hypertension. Blood pressure is well controlled.   DISPOSITION: The patient has 7 sons and a daughter who look after her by taking turns. The family requests that the patient be discharged home with hospice and arrangements were made. The patient remained a no code, DNR.  TIME SPENT: Forty minutes.    ____________________________ Wylie HailSona A. Allena KatzPatel, MD sap:TT D: 12/09/2013 13:37:18 ET T: 12/09/2013 16:35:52 ET JOB#: [id number]  cc: Shakil Dirk A. Allena KatzPatel, MD, <Dictator> Willow OraSONA A Nazly Digilio MD ELECTRONICALLY SIGNED 12/10/2013 14:04

## 2014-07-24 NOTE — H&P (Signed)
PATIENT NAME:  Megan Johns, Megan Johns MR#:  [id number] DATE OF BIRTH:  25-Jun-1930  DATE OF ADMISSION:  12/05/2013  ADMITTING PHYSICIAN: Enid Baasadhika Blen Ransome, MD.   PRIMARY ONCOLOGIST: Dr. Lorre NickGittin.    PRIMARY CARE PHYSICIAN: Dr. Sherrie MustacheFisher.   CHIEF COMPLAINT: Confusion and elevated sugars.   HISTORY OF PRESENT ILLNESS: Megan Johns is an 81 year old elderly Caucasian female with past medical history significant for hypertension, non-insulin-dependent diabetes mellitus, brain metastasis secondary to ovarian cancer, finished whole brain radiation, anxiety, comes from home secondary to confusion and elevated sugars. The patient is confused, oriented to self. At this time, most of the history is obtained from both of her sons who are at bedside. According to them, the patient's ovarian cancer was diagnosed incidentally 3 months ago when she had a CT of the head done for tinnitus and headaches, at which time they found metastasis in the brain of unknown primary. Then a whole body scan revealed possible ovarian mass, but no further biopsy done and no further treatment because of patient's age. She did finish whole brain irradiation and has been doing fine at home. She ambulates with a walker, able to care for herself while the sons help her at home. However, over the last week, her appetite has been decreased slowly, she has become very unsteady. They deny any fever or chills. They say that she has become very weak to the point that she could not even swallow her food. Her confusion has been getting worse, and they have noted every morning her urine smells very strong like ammonia. At home, her sugars were in 600 range so they brought her here. She has appeared to be septic here with tachycardia and elevated white count, and likely sources of UTI. She is being admitted for the same. Also, her sugars are also elevated and she is on an insulin drip.   PAST MEDICAL HISTORY:  1.  Metastatic brain disease, likely source primary  ovarian cancer.  2.  Hypertension.  3.  Non-insulin-dependent diabetes mellitus.  4.  Depression and anxiety.  5.  Spinal stenosis.  6.  Degenerative disk disease.   PAST SURGICAL HISTORY:  1.  Cholecystectomy.  2.  Hysterectomy.    ALLERGIES: PROPOXYPHENE, ROBAXIN, AND SALICYLATES.    CURRENT HOME MEDICATIONS:  1.  Decadron 4 mg p.o. b.i.d.  2.  Allopurinol 200 mg p.o. b.i.d.  3.  Lasix 40 mg p.o. b.i.d.  4.  Vitamin D 1 tablet twice a week.  5.  Lovastatin 20 mg p.o. at bedtime.  6.  Metoprolol 150 mg p.o. b.i.d.  7.  Klor-Con 20 mEq p.o. daily while on Lasix.  8.  Klonopin 1 mg p.o. b.i.d.  9.  Xanax 0.5 mg p.o. daily.  10.  Metformin 1000 mg p.o. b.i.d.    SOCIAL HISTORY: Lives at home with her son. She uses a walker at baseline and quit smoking about 3 months ago when she was diagnosed with ovarian cancer. No alcohol abuse.   FAMILY HISTORY: Dad with heart disease and sister with lung cancer.   REVIEW OF SYSTEMS: Not able to be obtained secondary to the patient's confusion.   PHYSICAL EXAMINATION: VITAL SIGNS: Temperature 97.9 degrees Fahrenheit, pulse 101, respirations 15, blood pressure 144/72, pulse oximetry 98% on room air.  GENERAL: Well-built, well-nourished female lying in bed moaning secondary to pain and discomfort as a Foley was just placed.  HEENT: Normocephalic, atraumatic. Pupils equal, round, reacting to light. Anicteric sclerae. Extraocular movements intact. Dry mucous membranes. Oropharynx otherwise  clear without erythema, mass, or exudates.  NECK: Supple. No thyromegaly, JVD or carotid bruits. No lymphadenopathy.  LUNGS: Moving air bilaterally. No wheezes or crackles noted. No use of accessory muscles for breathing.  CARDIOVASCULAR: S1, S2, regular rate and rhythm. A 2/6 systolic murmur present. No rubs or gallops.  ABDOMEN: Soft, but very tender and voluntary guarding diffusely with palpable nodules present. No rebound tenderness. Normal bowel sounds.   EXTREMITIES: No pedal edema. No clubbing or cyanosis, 2+ dorsalis pedis pulses palpable bilaterally.  SKIN: No acne, rash, or lesions.  LYMPHATICS: No cervical lymphadenopathy.  NEUROLOGIC: The patient is alert, following some commands. Able to move all 4 extremities, but complete neuro exam cannot be done secondary to the patient's confusion.  PSYCHOLOGICAL: The patient is awake, alert, oriented to self at this time.   LABORATORY DATA: WBC 8.0, hemoglobin 15.0, hematocrit 46.9, platelet count 124,000.  Sodium 140, potassium 3.7, chloride 102, bicarbonate 20, BUN 67, creatinine 1.86, glucose of 682, and calcium of 8.9. ALT 21, AST 14, alkaline phosphatase 100, total bilirubin 0.6, albumin of 2.6. Urinalysis with positive nitrites, 3+ leukocyte esterase, 3+ bacteria, 400 WBCs. Chest x-ray showing patchy right upper lobe opacity suspicious for pneumonia. ABG showing 7.46 pH,  pCO2 of 26, pO2 of 69, bicarbonate 18.5, and saturations of 94% on room air. Her anion gap is elevated at 18.   EKG showing sinus tachycardia. No acute ST-T wave abnormalities.   ASSESSMENT AND PLAN: An 81 year old female with hypertension, diabetes, ovarian cancer with brain metastasis status post whole brain radiation comes from home with sepsis.  1.  Sepsis. Elevated WBC, tachycardia, and confusion with elevated lactic acid. Blood and urine cultures have been ordered. Likely source is urine and now chest x-ray also showing pneumonia. IV Rocephin has been started. Will add azithromycin based on the x-ray findings. Continue fluids and monitor.  2.  Diabetes mellitus with uncontrolled hyperglycemia with elevated anion gap. Placed on IV insulin drip for non-diabetic ketoacidosis protocol. Admitted to Critical Care Unit stepdown for the same. Follow BMP later. Start her on Lantus, metformin sliding scale and will transfer to floor once her sugars are better. Hemoglobin A1c has been ordered.  3.  Acute renal failure, likely prerenal  and acute tubular necrosis from sepsis. Continue IV fluids and monitor. Hold her Lasix at this time and also allopurinol. Avoid nephrotoxins.   4.  Metabolic encephalopathy with confusion from urinary tract infection any infection. Continue fluids, antibiotics, and monitor.  5.  Ovarian cancer with brain metastasis. She finished whole brain radiation recently. Follows with Dr. Lorre Nick. Not on chemotherapy treatment for now. On Decadron, continue.  6.  Hypertension. On metoprolol which will be continued. Hold Lasix for now.  7.  Deep vein thrombosis prophylaxis. The patient on subcutaneous heparin for the same.  8.  Right upper lobe pneumonia as noted on chest x-ray. Blood cultures have been ordered. Start on azithromycin and Rocephin.   CODE STATUS: Do not resuscitate, as she has an out facility DNR present and confirmed with the son.   TOTAL CRITICAL CARE TIME SPENT ON THIS PATIENT: 60 minutes.     ____________________________ Enid Baas, MD rk:at D: 12/05/2013 13:19:59 ET T: 12/05/2013 13:36:16 ET JOB#: [id number]  cc: Enid Baas, MD, <Dictator> Knute Neu. Lorre Nick, MD Enid Baas MD ELECTRONICALLY SIGNED 12/07/2013 17:31

## 2014-07-24 NOTE — Consult Note (Signed)
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   Referred by Dr. Neale Burly.  Diagnosis:  Chief Complaint/Diagnosis   81 year old female presented with tinnitus of right ear found to have multiple brain lesions compatible with metastatic disease and large pelvic mass consistent with ovarian primary. For palliative whole brain radiation  Imaging Report CT scan abdomen and pelvis, chest and head CTs all reviewed   Planned Treatment Regimen whole brain radiation   HPI   patient is an 81 year old female with multiple comorbidities including obesity adults onset diabetes spinal stenosis who presented with increasing tinnitus of the right ear and some mild diffuse headaches. CT scan with and without contrast were performed showing at least 3 brain heads compatible with metastatic disease. CT scan of abdomen and chest which revealed bilateral adrenal masses consistent with metastatic disease small amount ofabdominal ascites and metastatic peritoneal tumor implants suspicious for bilateral ovarian cancer. Also was noted moderate severe right hydronephrosis. The results noted bilateral pulmonary metastasis. Patient is started on prednisone and nortriptyline. She specifically denies any change in visual fields or any motor or sensory loss. She is seen today accompanied by her son. She is being worked up by medical oncology and plans for biopsy of our pending.  Past Hx:    Arrythmias:    spine is narrowing:    diabetes:    anxiety:    htn:    gallbladder removal:    hysterectomy:   Past, Family and Social History:  Past Medical History positive   Cardiovascular hyperlipidemia; hypertension; arrhythmias   Endocrine diabetes mellitus   Neurological/Psychiatric anxiety; spinal stenosis   Past Surgical History cholecystectomy; hysterectomy   Past Medical History Comments osteoarthritis, B12  deficiency, gallop   Family History positive   Family History Comments positive for adult-onset diabetes hypertension, CVA and coronary artery disease   Social History positive   Social History Comments greater than 50-pack-year smoking history continues to smoke one pack per day   Additional Past Medical and Surgical History accompanied by her son today and   Allergies:   Salsalate: Headaches  Propoxyphene: Headaches  Robaxin: Weakness  Home Meds:  Home Medications: Medication Instructions Status  alprazolam 0.5 mg oral tablet 1 tab(s) orally once a day, As Needed Active  clonazepam 1 mg oral tablet 1 tab(s) orally 2 times a day Active  lisinopril 20 mg oral tablet 1 tab(s) orally once a day Active  lovastatin 20 mg oral tablet 1 tab(s) orally once a day Active  metoprolol succinate 50 mg oral tablet, extended release 1 tab(s) orally 2 times a day Active  Vitamin B-12 1000 mcg/mL injectable solution 1 milliliter ( ) subcutaneous once a month. Active  warfarin 3 mg oral tablet 1 tab(s) orally once a day EXCEPT mondays, take 1/2 tab (1.5mg ) Active  furosemide 20 mg oral tablet 2 tab(s) orally once a day Active  allopurinol 100 mg oral tablet 2 tab(s) orally once a day Active  Vitamin D2 50,000 intl units oral capsule 1 cap(s) orally 2 times a week Active  Klor-Con M20 20 mEq oral tablet, extended release 1 tab(s) orally once a day Active  nystatin topical 100000 units/g topical powder Apply topically to affected area 2 times a day Active  metFORMIN extended release 500 mg oral tablet, extended release 2 tab(s) orally 2 times a day Active  nortriptyline 10 mg oral capsule 2 cap(s) orally once a day Active  dexamethasone 4 mg oral tablet 1 tab(s) orally once a day Active   Review of Systems:  General negative   Performance Status (ECOG) 0   Skin negative   Breast negative   Ophthalmologic negative   ENMT negative   Respiratory and Thorax negative    Cardiovascular negative   Gastrointestinal negative   Genitourinary negative   Musculoskeletal negative   Neurological see HPI   Hematology/Lymphatics negative   Endocrine see HPI   Allergic/Immunologic negative   Review of Systems   review of systems obtained from nurses notes  Nursing Notes:  Nursing Vital Signs and Chemo Nursing Nursing Notes: *CC Vital Signs Flowsheet:   15-May-15 10:31  Temp Temperature 97.9  Pulse Pulse 70  Respirations Respirations 20  SBP SBP 141  DBP DBP 73  Pain Scale (0-10)  0  Current Weight (kg) (kg) 91.9  Height (cm) centimeters 166  BSA (m2) 1.9   Physical Exam:  General/Skin/HEENT:  General normal   Skin normal   Eyes normal   ENMT normal   Head and Neck normal   Additional PE well-developed obese female in NAD she uses a walker for him [employer] assistance. Cranial nerves II through XII are grossly intact. Motor sensory and DTR levels were equal and symmetric in upper and lower extremities. Visual fields within normal range. Lungs are clear to A&P cardiac examination shows irregular irregular heart beat. Abdomen is obese.   Breasts/Resp/CV/GI/GU:  Respiratory and Thorax normal   Cardiovascular normal   Gastrointestinal normal   Genitourinary normal   MS/Neuro/Psych/Lymph:  Musculoskeletal normal   Neurological normal   Lymphatics normal   Other Results:  Radiology Results: LabUnknown:    04-May-15 10:51, CT Head [facility] Contrast  PACS Image     13-May-15 09:44, CT Chest Abdomen and Pelvis WO  PACS Image   CT:    04-May-15 10:51, CT Head [facility] Contrast  CT Head [facility] Contrast   REASON FOR EXAM:    LABS 1ST Headache Tinnitus Evaluate for intracranial   pathology tumor  COMMENTS:       PROCEDURE: KCT - KCT HEAD W/WO CONTRAST  - Aug 03 2013 10:51AM     CLINICAL DATA:  81 year old female with abnormal noise on the right  side for about 7 days. Headache. Tinnitus. Initial encounter.    EXAM:  CT HEAD WITHOUT AND  WITH CONTRAST    TECHNIQUE:  Contiguous axial images were obtained from the base of the skull  through the vertex without and with intravenous contrast  CONTRAST:  60 mL Isovue-300.    COMPARISON:  None.    FINDINGS:  Visualized paranasal sinuses and mastoids are clear. No acute  osseous abnormality identified. Negative orbit and scalp soft  tissues.    There are multiple enhancing lesions identified in the brain. The  largest is about 16 mm diameter at the right vertex (series 4,  images 22 and 23), demonstrating primarily rim enhancement. There is  adjacent abnormal white matter hypodensity involving the right  parietal lobe in a vasogenic edema pattern.  Asecond lesion is identified along the anterior left frontal lobe  measuring 14 mm (series 4, image 16) with associated edema. A third  lesion is identified along the interface between the right  cerebellum and posterior temporal lobe measuring 10 mm (image 11).  That lesion might be dural based, uncertain.    Despite the associated cerebral edema, there is no intracranial mass  effect, or significant mass effect on the ventricles. Basilar  cisterns remain patent. No brainstem or cerebellopontine angle  involvement is identified. No definite additional posterior fossa  lesion is identified. No superimposed acute cortically based  infarct, or acute intracranial hemorrhage.     IMPRESSION:  Three small enhancing brain masses are identified, up to 16 mm  diameter. Associated cerebral edema but no significant intracranial  mass effect. The nonspecific, findings are most suspicious for  metastatic disease. Followup brain MRI without and with contrast is  the preferred modality for further characterization.    Study discussed by telephone with Dr. Mila Merry on 08/03/2013 at  12:29 .      Electronically Signed    By: Augusto Gamble M.D.    On: 08/03/2013 12:32         Verified By: Kevan Ny. HALL, M.D.,    13-May-15 09:44,  CT Chest Abdomen and Pelvis WO  CT Chest Abdomen and Pelvis WO   REASON FOR EXAM:    CA unknown primary site  pt takes Metformin  COMMENTS:       PROCEDURE: KCT - KCT CHEST/ABD/PELVIS WITHOUT  - Aug 12 2013  9:44AM     CLINICAL DATA:  Brain metastases of unknown primary.    EXAM:  CT CHEST, ABDOMEN AND PELVIS WITHOUT CONTRAST    TECHNIQUE:  Multidetector CT imaging of the chest, abdomen and pelvis was  performed following the standard protocol without IV contrast.    COMPARISON:  None.  FINDINGS:  CT CHEST FINDINGS    Multiple pulmonary nodules are seen in both lungs, largest in the  posterior left lower lobe measuring 3.0 cm on image 41. These are  consistent with pulmonary metastases. Mediastinal lymphadenopathy is  seen in the right paratracheal region with largest lymph node  measuring 2.1 cm on image 17. Numerous tiny less than 1 cm  mediastinal lymph nodes are also seen in the prevascular space and  lateral aortic region.    No evidence of pleural or pericardial effusion. No evidence of chest  wall mass or suspicious bone lesions.    CT ABDOMEN AND PELVIS FINDINGS  Noncontrast images of the liver, pancreas, spleen, and adrenal  glands are normal in appearance. Moderate to severe right  hydroureteronephrosis is demonstrated, but no intrarenal masses are  seen on this noncontrast study. This isdue to distal right ureteral  obstruction by a pelvic mass.    Heterogeneous pelvic masses are seen in both adnexal regions. The  left adnexal mass measures 5.4 x 12.2 cm on image 99 and the right  adnexal mass measures 6.1 x 7.1 cm on image 94. Small amount of  pelvic ascites is seen. A soft tissue mass is seen in the left  pelvic omental fat which measures 3.0 cm on image 91. This is  consistent with peritoneal carcinomatosis.    Prior hysterectomy noted. No pathologically enlarged lymph nodes  identified within the abdomen or pelvis. No evidence inflammatory  process or  abscess. No evidence of bowel obstruction. No suspicious  bone lesions identified.     IMPRESSION:  Bilateral adnexal masses measuring 12.2 cm on the left and 7.1 cm on  the right, with small amount of ascites and metastatic peritoneal  tumor implant in the left pelvic omentum. This is suspicious for  bilateral ovarian carcinoma, with differential diagnosis also  including metastatic disease to the ovaries.    Moderate severe right hydronephrosis due to distal right ureteral  obstruction by right adnexal mass.    Bilateral pulmonary metastases.  Mediastinal lymphadenopathy.  Electronically Signed    By: Myles RosenthalJohn  Stahl M.D.    On: 08/12/2013 12:29         Verified By: Danae OrleansJOHN A. 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 I'm going ahead with treatment planning for whole brain radiation therapy. Generally these lesions are somewhatradioresistant it is truly a primary ovarian cancer. Would treat her to a slightly higher dose to whole brain 3750 cGy in 15 fractions at 250 cGy per fraction to her whole brain. Risks and benefits of treatment were reviewed with the patient and she seems to comprehend my treatment plan well along with her son. They will be meet with medical oncology early next week. I have gone ahead and scheduled CT simulation of her whole brain early next week and hopefully we'll get to fractions in next week. Risks and benefits of treatment including hair loss, fatigue, alteration of blood counts all were explained in detail to the patient and her son. Case was discussed personally with medical oncology.  I would like to  take this opportunity for allowing me to participate in the care of your patient..  CC Referral:  cc: Dr. Mila Merryonald Fisher   Electronic Signatures: Rushie Chestnuthrystal, Gordy CouncilmanGlenn S (MD)  (Signed 15-May-15 11:38)  Authored: HPI, Diagnosis, Past Hx, PFSH, Allergies, Home Meds, ROS, Nursing Notes, Physical Exam, Other Results, Relevent Results, Encounter Assessment and Plan, CC Referring Physician   Last Updated: 15-May-15 11:38 by Rebeca Alerthrystal, Caree Wolpert S (MD)

## 2014-07-25 NOTE — Consult Note (Signed)
PATIENT NAME:  Megan Johns, Megan Johns MR#:  [id number] DATE OF BIRTH:  [birth year]  DATE OF CONSULTATION:  09/03/2011  CONSULTING PHYSICIAN:  Linus Galasodd Shernell Saldierna, DPM  REASON FOR CONSULTATION: The patient is a 81 year old female with a history of diabetes who recently has experienced some swelling and redness and pain in her left foot. She does not specifically recall any injury. She states it has been going on for about a week. It did keep her awake at night on a couple of occasions. Upon further questioning, the patient does relate recently discontinuing her allopurinol that was prescribed by Dr. Sherrie MustacheFisher. She stopped this a couple of weeks ago.   PAST MEDICAL HISTORY:  1. Morbid obesity.  2. Benign hypertension.  3. Hyperlipidemia.  4. Type 2 diabetes.  5. Osteoarthritis.  6. Spinal stenosis.  7. Anxiety.  8. B12 deficiency.  9. Gout.   MEDICATIONS:  1. B12 100 mcg IM every month.  2. Toprol-XL 50 mg p.o. b.i.d. 3. Metformin 750 mg p.o. daily.  4. Mevacor 20 mg p.o. daily.  5. Zestril 20 mg p.o. daily.  6. Jantoven 3 mg p.o. at bedtime.  7. Lasix 20 mg p.o. daily.  8. Klonopin 1 mg p.o. b.i.d. 9. Allopurinol 100 mg p.o. daily.   ALLERGIES: No known drug allergies.   FAMILY HISTORY: Positive for diabetes, hypertension, stroke, coronary artery disease.   SOCIAL HISTORY: The patient smokes 1 pack of cigarettes per day. She denies alcohol use.   REVIEW OF SYSTEMS: She does relate some swelling and redness in the left foot. She denies any injury. She currently has no fever or chills. She relates some occasional numbness sensations in the lower extremities. She denies any chest pain or shortness of breath. No stomach pain or heartburn. No dysuria. She denies hearing or vision changes.   PHYSICAL EXAMINATION:  VASCULAR: DP and PT pulses are palpable 1/4. Capillary filling time appears intact.   NEUROLOGICAL: There is some loss of protective threshold distally in the toes. Proprioception is impaired.    INTEGUMENT: Skin is warm, dry, and somewhat atrophic with significant edema in the left lower sternum as compared to the right. Some mild erythema at the base of the central toes. No clear ulceration is noted. There is a plantar lesion beneath the left second metatarsal area and the right fourth. No active ulceration on debridement of those. Toenails are thick, dystrophic and discolored, brittle x10.   MUSCULOSKELETAL: Stiff range of motion in the pedal joints. Muscle testing is 4 out of 5.   X-RAYS: Report was reviewed which revealed no evidence of any fracture in the left foot. Diffuse osteopenia was noted.   IMPRESSION:  1. Pain and swelling left foot, probable gout flare-up.  2. Diabetes with onychomycosis and hyperkeratosis.   PLAN: We will draw a uric acid level. I debrided the toenails and trimmed the lesions on the feet. I discussed with the patient that coming off of the allopurinol could have set off a gout flare-up. We will follow her outpatient as needed.  ____________________________ Linus Galasodd Akaiya Touchette, DPM tc:cbb D: 09/03/2011 13:24:41 ET T: 09/03/2011 13:43:20 ET JOB#: [id number]  cc: Linus Galasodd Yassir Enis, DPM, <Dictator> Ashmi Blas DPM ELECTRONICALLY SIGNED 09/14/2011 9:28

## 2014-07-25 NOTE — H&P (Signed)
PATIENT NAME:  Megan Johns, Megan W MR#:  [id number] DATE OF BIRTH:  1930-08-22  DATE OF ADMISSION:  09/02/2011  REFERRING PHYSICIAN: Janalyn Harderavid Kaminski, MD  PRIMARY CARE PHYSICIAN: Mila Merryonald Fisher, MD  REASON FOR ADMISSION: Left lower extremity cellulitis with diabetes.   HISTORY OF PRESENT ILLNESS: The patient is a 81 year old female with a history of osteoarthritis, obesity, diabetes, and spinal stenosis, also venous stasis. She presents to the Emergency Room with a three day history of progressive left distal lower extremity swelling, pain, and redness. She has had chills and fevers. She has had malaise and nausea. Some diarrhea. In the Emergency Room, the patient was noted to be febrile with an elevated white count with signs and symptoms consistent with cellulitis. She is now admitted for further evaluation.   PAST MEDICAL HISTORY:  1. Morbid obesity.  2. Benign hypertension.  3. Hyperlipidemia.  4. Type 2 diabetes.  5. Osteoarthritis.  6. Spinal stenosis.  7. Anxiety.  8. B12 deficiency.  9. Gout.   MEDICATIONS:  1. B12 1000 mcg IM every month.  2. Toprol-XL 50 mg p.o. twice a day. 3. Metformin 750 mg p.o. daily.  4. Mevacor 20 mg p.o. daily.  5. Zestril 20 mg p.o. daily.  6. Jantoven 3 mg p.o. at bedtime.  7. Lasix 20 mg p.o. daily.  8. Klonopin 1 mg p.o. twice a day. 9. Allopurinol 100 mg p.o. daily.   ALLERGIES: No known drug allergies.   SOCIAL HISTORY: The patient does smoke 1 pack per day. No history of alcohol abuse.   FAMILY HISTORY: Positive for diabetes, hypertension, stroke, and coronary artery disease.   REVIEW OF SYSTEMS: CONSTITUTIONAL: Positive fever. No change in weight. EYES: No blurred or double vision. No glaucoma. ENT: No tinnitus or hearing loss. No nasal discharge or bleeding. No difficulty swallowing. RESPIRATORY: No cough or wheezing. No painful respiration. CARDIOVASCULAR: No chest pain or orthopnea. No palpitations or syncope. GI: No vomiting or abdominal  pain. Some loose stools. GU: No dysuria or hematuria. No incontinence. ENDOCRINE: No polyuria or polydipsia. No heat or cold intolerance. HEMATOLOGIC: The patient denies anemia, easy bruising, or bleeding. LYMPHATIC: No swollen glands. MUSCULOSKELETAL: The patient denies pain in her neck, back, shoulders, knees, or hips. Does have gout. NEUROLOGIC: No numbness or migraines. Denies stroke or seizures. PSYCH: The patient denies anxiety, insomnia, or depression.   PHYSICAL EXAMINATION:   GENERAL: The patient is obese and in no acute distress.   VITAL SIGNS: Vital signs are currently remarkable for a blood pressure of 120/71 with a heart rate of 92 and a respiratory rate of 19. Temperature 100.01.   HEENT: Normocephalic, atraumatic. Pupils equally round and reactive to light and accommodation. Extraocular movements are intact. Sclerae are anicteric. Conjunctivae are clear. Oropharynx is clear.   NECK: Supple without jugular venous distention. No adenopathy or thyromegaly was noted.   LUNGS: Clear to auscultation and percussion without wheezes, rales, or rhonchi. No dullness.   HEART: Regular rate and rhythm. Normal S1 and S2. No significant rubs, murmurs, or gallops. PMI is nondisplaced. Chest wall is nontender.   ABDOMEN: Soft and nontender with normoactive bowel sounds. No organomegaly or masses were appreciated. No hernias or bruits were noted.   EXTREMITIES: 1+ edema on the right with 2+ edema from above the ankle down through the foot. There is tenderness and swelling and erythema. Distal pulses are 1+ bilaterally.   NEUROLOGIC: Examination reveals stocking glove neuropathy, but is otherwise unremarkable.   PSYCH: Examination reveals a patient  who is alert and oriented to person, place, and time. She is cooperative and used good judgment.   LABS/STUDIES: Plain films of the left foot were unremarkable.   Ultrasound revealed no evidence of left lower extremity deep vein thrombosis.   White  count was elevated at 11.9. Glucose was 168.   ASSESSMENT:  1. Left lower extremity cellulitis.  2. Type 2 diabetes.  3. Diabetic neuropathy.  4. Diabetic nephropathy.  5. Benign hypertension.  6. Obesity.  7. Venostasis with peripheral edema.   PLAN: The patient will be admitted to the floor and started on IV antibiotics after blood cultures are obtained. We will follow her sugars with Accu-Cheks before meals and at bedtime and add sliding scale insulin as needed. We will monitor her blood pressure closely as well. We will consult podiatry in the morning. Further treatment and evaluation will depend upon the patient's progress.   TOTAL TIME SPENT: 50 minutes.  ____________________________ Duane Lope Judithann Sheen, MD jds:slb D: 09/02/2011 22:28:03 ET     T: 09/03/2011 07:46:20 ET       JOB#: [id number] cc: Demetrios Isaacs. Sherrie Mustache, MD Marguarite Arbour MD ELECTRONICALLY SIGNED 09/03/2011 14:56

## 2014-07-25 NOTE — Discharge Summary (Signed)
PATIENT NAME:  Megan Johns, Megan Johns MR#:  [id number] DATE OF BIRTH:  1930-06-01  DATE OF ADMISSION:  09/02/2011 DATE OF DISCHARGE:  09/04/2011  DISCHARGE DIAGNOSES:  1. Left foot pain possibly due to cellulitis and possible gout flare.  2. Diabetes.  3. Hypertension.  4. Normocytic anemia. 5. Hyperlipidemia. 6. Anxiety. 7. History of smoking.   DISPOSITION: The patient is being discharged home with home health.   DIET: Low sodium, ADA diet.  ACTIVITY: As tolerated. Discharge home with home health and physical therapy.  DISCHARGE FOLLOWUP:  Followup with primary care physician, Dr. Sherrie MustacheFisher, in 1 to 2 weeks after discharge.   DISCHARGE MEDICATIONS:  1. Allopurinol 300 mg daily.  2. Augmentin 875 mg twice a day for seven days.  3. Xanax 0.5 mg once a day. 4. Klonopin 1 mg twice a day. 5. Lisinopril 20 mg daily.  6. Lovastatin 20 mg daily.  7. Toprol-XL 50 mg twice a day. 8. Vitamin B12 1000 mcg/mL injection monthly. 9. Lasix 20 mg twice a day. 10. Jantoven 30 mg daily.  11. Flomax 0.4 mg daily.  12. Metformin 750 mg 1 tablet in the morning and 2 tablets in the evening   CONSULTANTS: Linus Galasodd Cline, DPM - Podiatry.  RESULTS: Foot x-ray showed osteopenia with degenerative joint disease.  Lower extremity ultrasound, left-sided, showed no evidence of DVT.   INR 2. White count 11.9, hemoglobin 12.7, platelet count 281. Glucose 168. The rest of BMP was normal. Uric acid 5.6.   HOSPITAL COURSE: The patient is a 81 year old female with past medical history of gout, diabetes, hypertension, B12 deficiency, osteoarthritis, and spinal stenosis who presented with lower extremity swelling and pain. She was admitted with the diagnosis of lower extremity cellulitis. The patient also has a diagnosis of gout and stopped taking her allopurinol several weeks ago. She was evaluated by Dr. Alberteen Spindleline from podiatry who felt that she may also be having a gout attack. Therefore, she was treated with empiric  antibiotics, restarted on her allopurinol, and also given four doses of a NSAID with good response. Her other work-up including lower extremity Doppler's and x-rays were essentially negative. Her blood cultures were negative so far. Her diabetes and her hypertension remained controlled. She was extensively counseled about smoking cessation. She is being discharged home in stable condition with home health and physical therapy.  TIME SPENT: 45 minutes. ____________________________ Darrick MeigsSangeeta Javier Mamone, MD sp:slb D: 09/04/2011 14:57:41 ET T: 09/05/2011 10:43:43 ET JOB#: [id number]  cc: Darrick MeigsSangeeta Dellas Guard, MD, <Dictator> Demetrios Isaacsonald E. Sherrie MustacheFisher, MD Darrick MeigsSANGEETA Masiah Woody MD ELECTRONICALLY SIGNED 09/05/2011 14:58

## 2016-04-01 IMAGING — CT CT ASPIRATION
1 of 2 series · 10 of 16 positions shown, 13 images · non-contrast
Comparison: none

CLINICAL DATA: Bilateral ovarian masses and omental mass.

[Series 2: routine abd pel wo · axial · 0.86mm/px · z∈[+541,+739]mm · 10 of 121 slices shown, 13 images]
[im 11/121  soft-tissue]
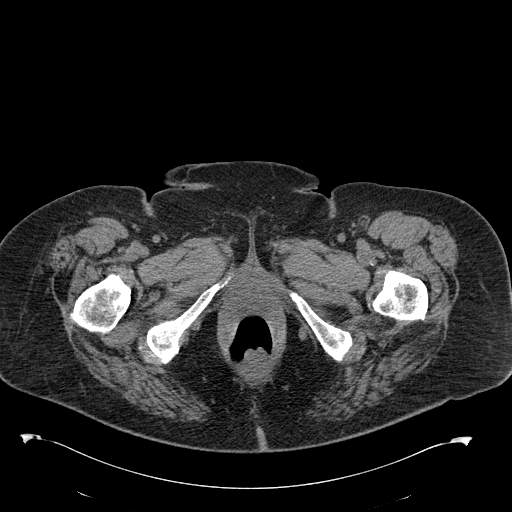
[im 11/121  bone]
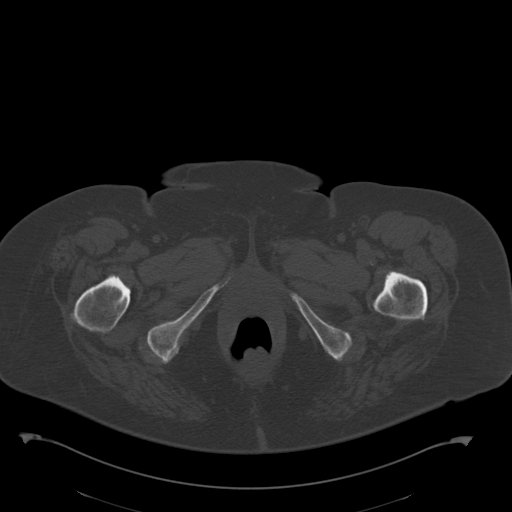
[im 22/121  soft-tissue]
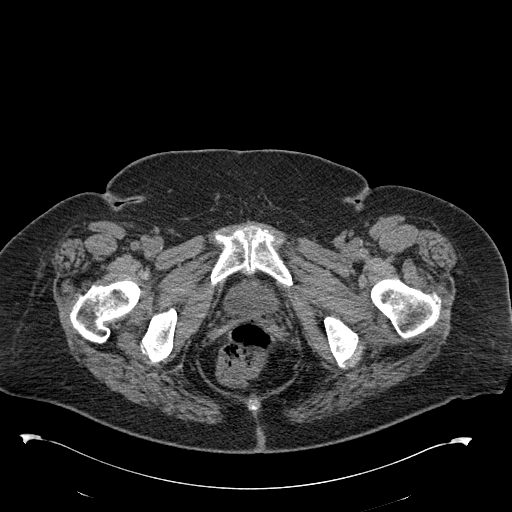
[im 33/121  soft-tissue]
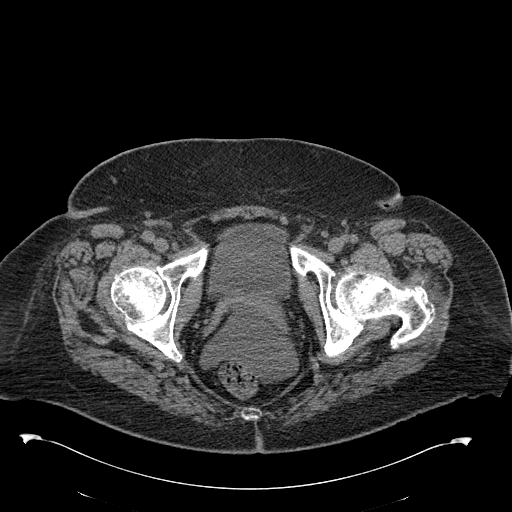
[im 44/121  soft-tissue]
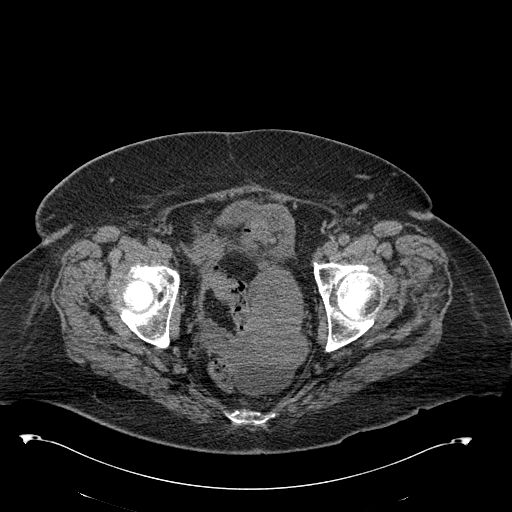
[im 55/121  soft-tissue]
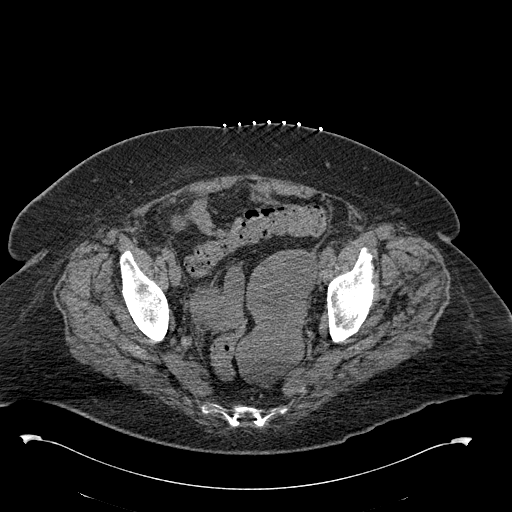
[im 55/121  bone]
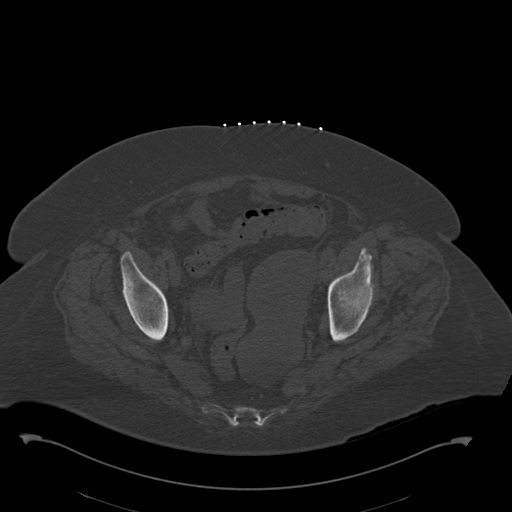
[im 66/121  soft-tissue]
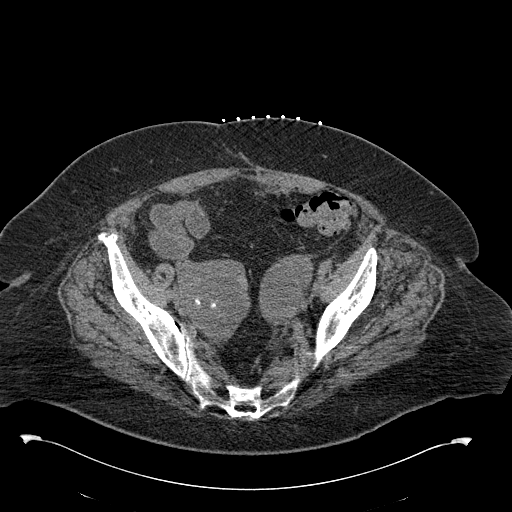
[im 77/121  soft-tissue]
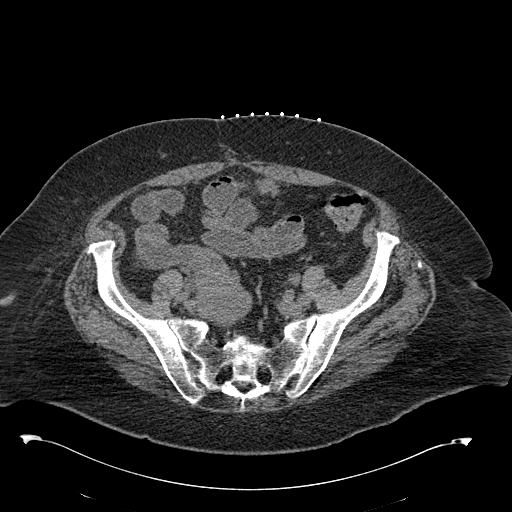
[im 88/121  soft-tissue]
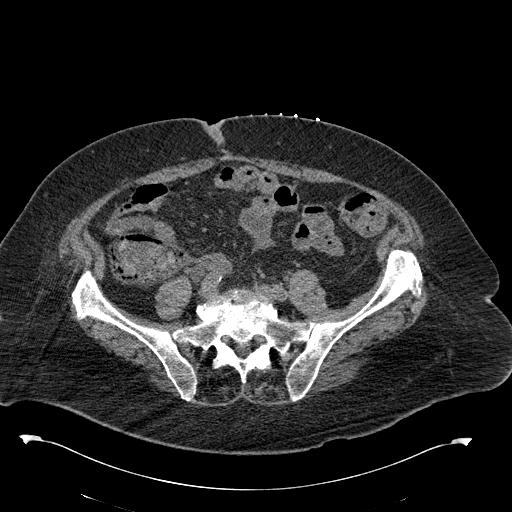
[im 99/121  soft-tissue]
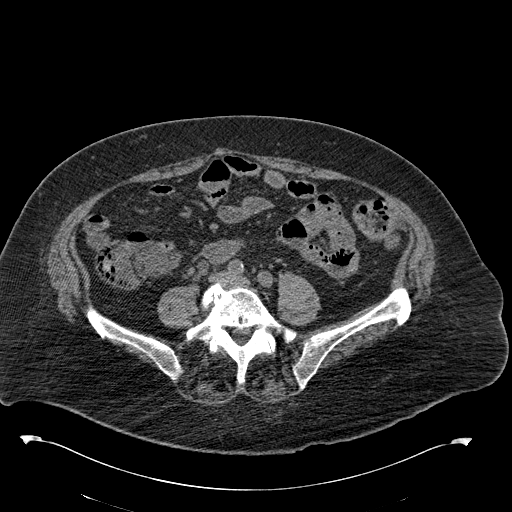
[im 99/121  bone]
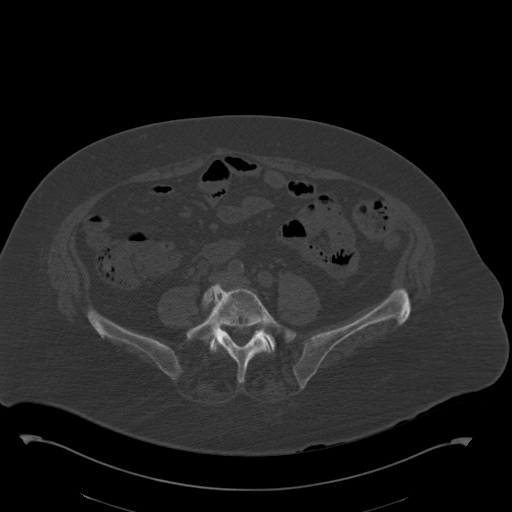
[im 110/121  soft-tissue]
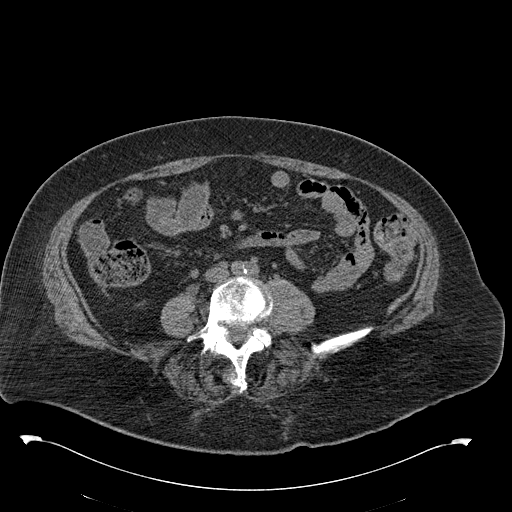

[10 of 16 positions shown; findings below may reference images not displayed]

EXAM:
CT GUIDED NEEDLE ASPIRATE BIOPSY OF omental mass.

ANESTHESIA/SEDATION:
0.5  Mg IV Versed; 25 mcg IV Fentanyl

Total Moderate Sedation Time: 10 minutes.

PROCEDURE:
The procedure risks, benefits, and alternatives were explained to
the patient. Questions regarding the procedure were encouraged and
answered. The patient understands and consents to the procedure.

The lower abdomen was prepped with chlorhexidinein a sterile
fashion, and a sterile drape was applied covering the operative
field. A sterile gown and sterile gloves were used for the
procedure. Local anesthesia was provided with 1% Lidocaine.

18 gauge core biopsy system was introduced into the lesion and 2 18
gauge 1.3 mm core samples obtained.

Complications: None.
FINDINGS: Successful CT-directed core biopsy of omental mass. No
complications.
IMPRESSION: Successful CT-directed core biopsy of omental mass.
